# Patient Record
Sex: Female | Born: 1975 | Race: White | Hispanic: No | Marital: Married | Smoking: Former smoker
Health system: Southern US, Community
[De-identification: ages and names within clinical notes are randomized; demographics above are authoritative.]

## PROBLEM LIST (undated history)

## (undated) DIAGNOSIS — Z9889 Other specified postprocedural states: Secondary | ICD-10-CM

## (undated) DIAGNOSIS — Z8719 Personal history of other diseases of the digestive system: Secondary | ICD-10-CM

## (undated) DIAGNOSIS — Z9049 Acquired absence of other specified parts of digestive tract: Secondary | ICD-10-CM

## (undated) DIAGNOSIS — Z9851 Tubal ligation status: Secondary | ICD-10-CM

## (undated) HISTORY — PX: HERNIA REPAIR: SHX51

## (undated) HISTORY — DX: Other specified postprocedural states: Z98.890

## (undated) HISTORY — PX: BREAST SURGERY: SHX581

## (undated) HISTORY — PX: CHOLECYSTECTOMY: SHX55

## (undated) HISTORY — PX: TUBAL LIGATION: SHX77

## (undated) HISTORY — DX: Personal history of other diseases of the digestive system: Z87.19

## (undated) HISTORY — DX: Tubal ligation status: Z98.51

## (undated) HISTORY — DX: Acquired absence of other specified parts of digestive tract: Z90.49

---

## 1996-06-24 DIAGNOSIS — Z9851 Tubal ligation status: Secondary | ICD-10-CM

## 1996-06-24 HISTORY — DX: Tubal ligation status: Z98.51

## 2003-11-03 DIAGNOSIS — Z9049 Acquired absence of other specified parts of digestive tract: Secondary | ICD-10-CM

## 2003-11-03 HISTORY — DX: Acquired absence of other specified parts of digestive tract: Z90.49

## 2004-01-02 ENCOUNTER — Other Ambulatory Visit: Admission: RE | Admit: 2004-01-02 | Discharge: 2004-01-02 | Payer: Self-pay | Admitting: Family Medicine

## 2004-03-21 ENCOUNTER — Ambulatory Visit (HOSPITAL_COMMUNITY): Admission: RE | Admit: 2004-03-21 | Discharge: 2004-03-21 | Payer: Self-pay | Admitting: Internal Medicine

## 2004-03-21 ENCOUNTER — Encounter (INDEPENDENT_AMBULATORY_CARE_PROVIDER_SITE_OTHER): Payer: Self-pay | Admitting: Specialist

## 2004-04-30 ENCOUNTER — Ambulatory Visit: Payer: Self-pay | Admitting: Internal Medicine

## 2004-05-25 ENCOUNTER — Ambulatory Visit: Payer: Self-pay | Admitting: Internal Medicine

## 2005-04-15 ENCOUNTER — Ambulatory Visit (HOSPITAL_COMMUNITY): Admission: RE | Admit: 2005-04-15 | Discharge: 2005-04-15 | Payer: Self-pay | Admitting: Family Medicine

## 2005-04-15 ENCOUNTER — Other Ambulatory Visit: Admission: RE | Admit: 2005-04-15 | Discharge: 2005-04-15 | Payer: Self-pay | Admitting: *Deleted

## 2005-04-24 ENCOUNTER — Ambulatory Visit (HOSPITAL_COMMUNITY): Admission: RE | Admit: 2005-04-24 | Discharge: 2005-04-24 | Payer: Self-pay | Admitting: Internal Medicine

## 2005-04-24 ENCOUNTER — Ambulatory Visit: Payer: Self-pay | Admitting: Internal Medicine

## 2005-05-01 ENCOUNTER — Ambulatory Visit (HOSPITAL_COMMUNITY): Admission: RE | Admit: 2005-05-01 | Discharge: 2005-05-01 | Payer: Self-pay | Admitting: Internal Medicine

## 2005-05-06 ENCOUNTER — Ambulatory Visit: Payer: Self-pay | Admitting: Gastroenterology

## 2005-05-06 ENCOUNTER — Ambulatory Visit (HOSPITAL_COMMUNITY): Admission: RE | Admit: 2005-05-06 | Discharge: 2005-05-06 | Payer: Self-pay | Admitting: Gastroenterology

## 2005-09-16 ENCOUNTER — Ambulatory Visit (HOSPITAL_COMMUNITY): Admission: RE | Admit: 2005-09-16 | Discharge: 2005-09-16 | Payer: Self-pay | Admitting: Surgery

## 2005-10-30 DIAGNOSIS — Z8719 Personal history of other diseases of the digestive system: Secondary | ICD-10-CM

## 2005-10-30 DIAGNOSIS — Z9889 Other specified postprocedural states: Secondary | ICD-10-CM

## 2005-10-30 HISTORY — DX: Personal history of other diseases of the digestive system: Z87.19

## 2008-11-08 ENCOUNTER — Emergency Department (HOSPITAL_COMMUNITY): Admission: EM | Admit: 2008-11-08 | Discharge: 2008-11-08 | Payer: Self-pay | Admitting: Emergency Medicine

## 2009-11-13 IMAGING — CR DG ANKLE COMPLETE 3+V*L*
3 series · 3 of 3 positions shown · non-contrast
Comparison: None

CLINICAL DATA: Left ankle with twisting injury.

LEFT ANKLE COMPLETE - 3+ VIEW

[view not recorded (1 of 3)]
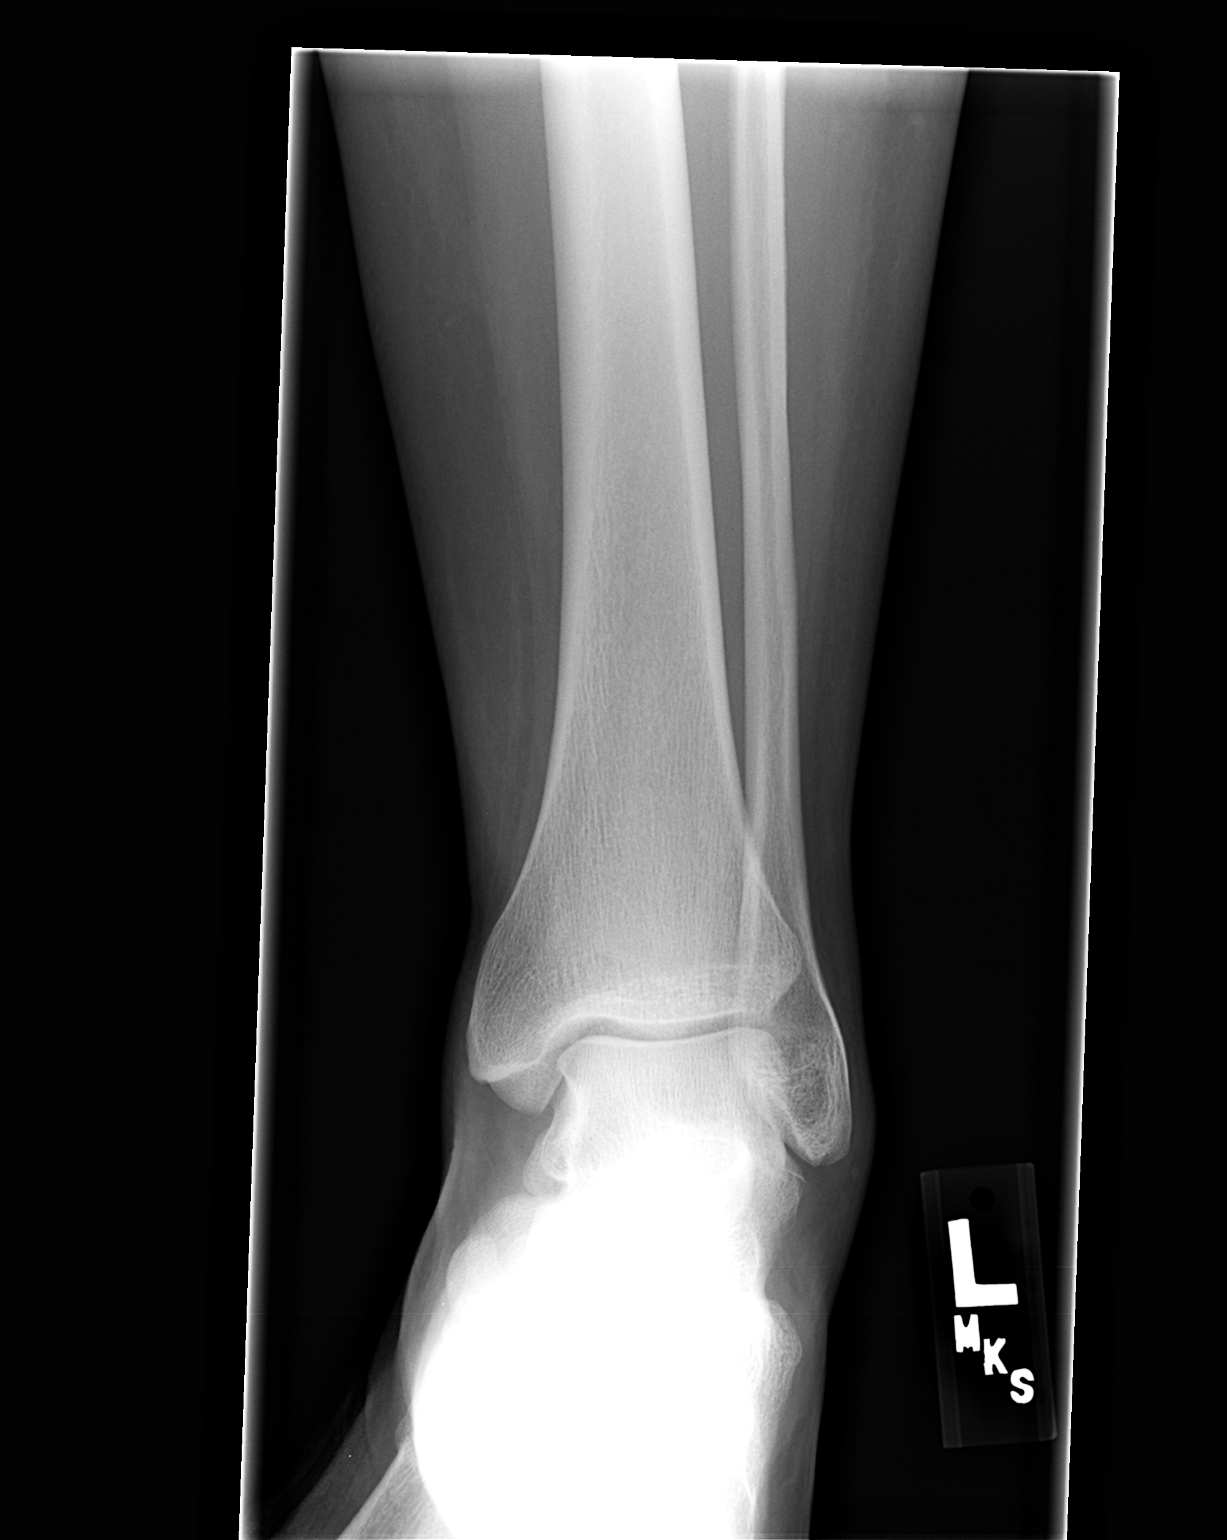

[view not recorded (2 of 3)]
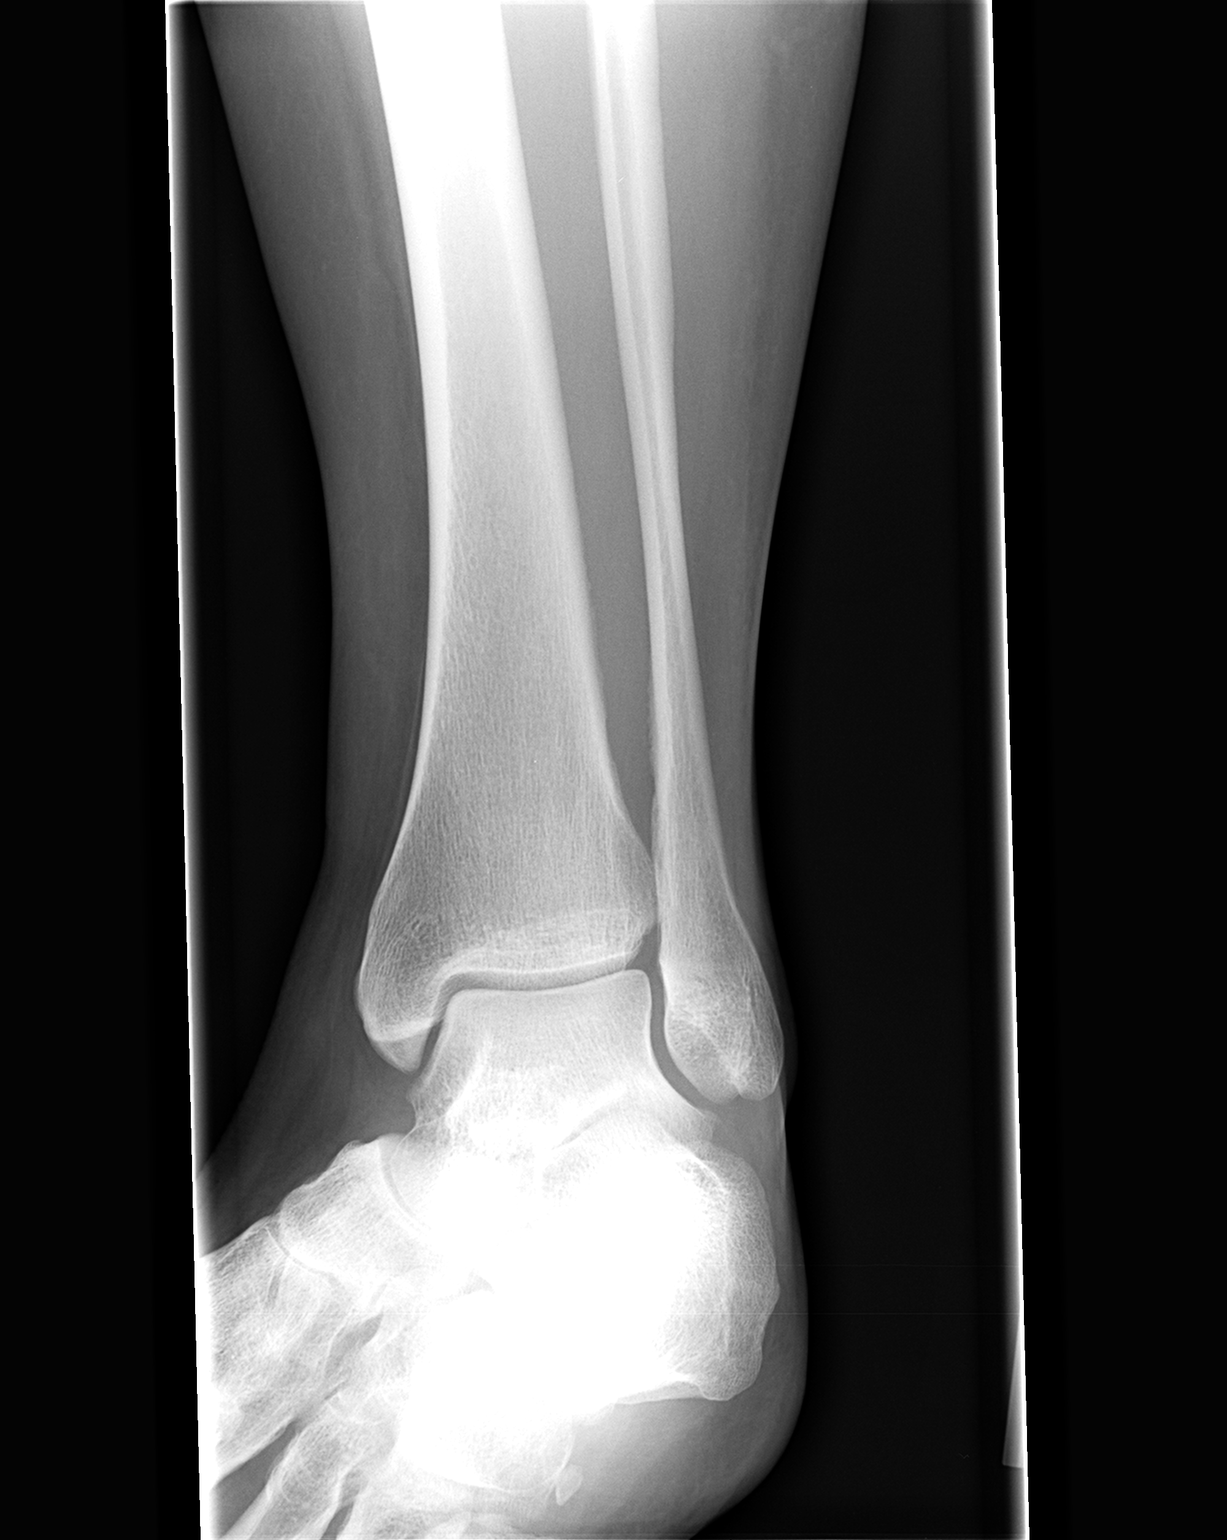

[view not recorded (3 of 3)]
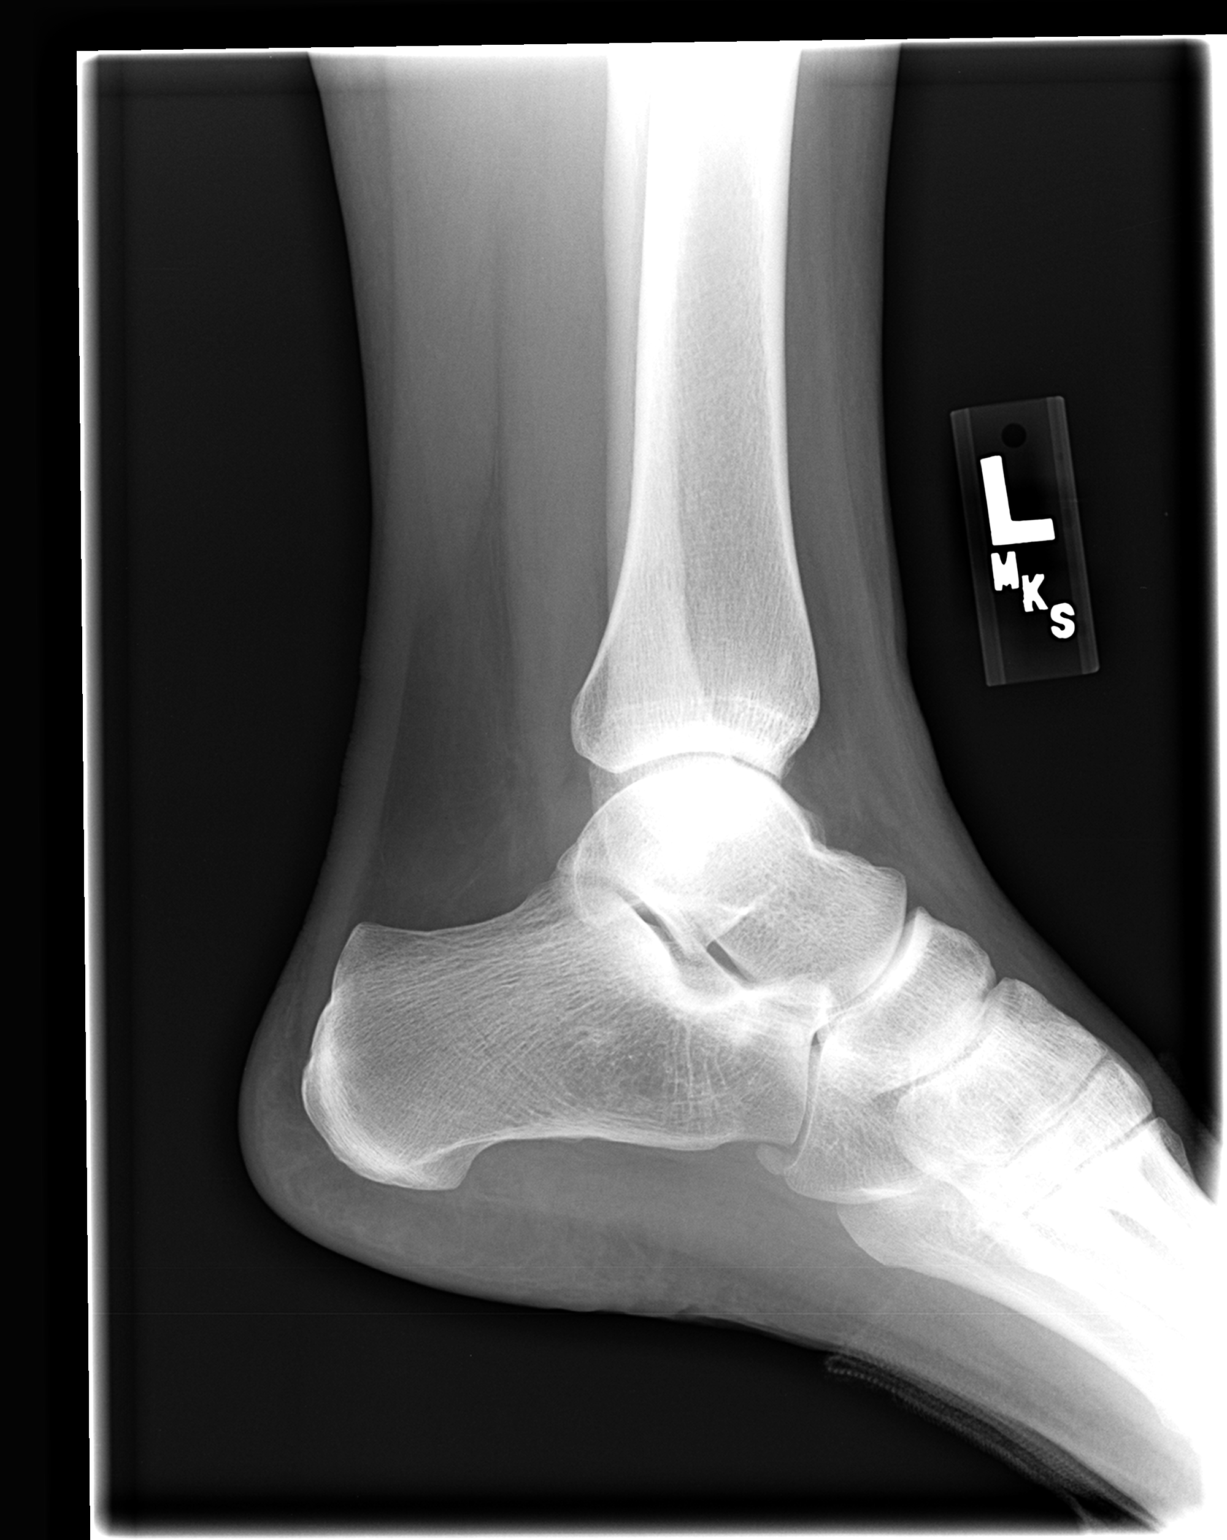

[3 of 3 positions shown; findings below may reference images not displayed]

FINDINGS: There is no evidence of fracture, dislocation or joint
effusion.  There is no evidence of arthropathy or other focal bone
abnormality.  Soft tissues are unremarkable.
IMPRESSION: Negative.

## 2010-07-14 ENCOUNTER — Encounter: Payer: Self-pay | Admitting: Family Medicine

## 2010-11-09 NOTE — Op Note (Signed)
[name], Latoya Lucero               ACCOUNT NO.:  [id number]   MEDICAL RECORD NO.:  [id number]          PATIENT TYPE:  AMB   LOCATION:  DAY                          FACILITY:  [facility]   PHYSICIAN:  Thornton Park. Daphine Deutscher, MD  DATE OF BIRTH:  24-Jan-1976   DATE OF PROCEDURE:  09/16/2005  DATE OF DISCHARGE:  09/16/2005                                 OPERATIVE REPORT   PREOPERATIVE DIAGNOSIS:  Umbilical hernia after previous laparoscopic  surgery.   POSTOPERATIVE DIAGNOSIS:  Umbilical hernia after previous laparoscopic  surgery.   PROCEDURE:  Repair of umbilical hernia with Ventralex mesh the 1.7 inches in  diameter.   SURGEON:  Thornton Park. Daphine Deutscher, M.D.   ASSISTANT:  None.   ANESTHESIA:  General endotracheal.   DESCRIPTION OF PROCEDURE:  [name] was taken to O.R. 6 at [facility] on  the afternoon of September 16, 2005 and given general anesthesia.  The abdomen  was prepped with Betadine and draped sterilely.  I gained access the abdomen  with a 5 mm 0-degree scope through the left upper quadrant, placed a second  5 mm port in the left side.  I lysed the adhesion that was stuck up to the  anterior abdominal wall, and in the area of her discomfort which was around  the umbilicus I could see that the fascia was attenuated, and it had a  little popping sensation with a little preperitoneal hernia.  I went ahead  and cut down this and removed all suture and found this attenuation.  I went  ahead and opened it, put my finger inside, and felt well around this area,  and did not feel any other hernia defects other than this area.  I repaired  this by inserting a piece of Ventralex mesh, suturing it to the perimeter  with six sutures of 0 Prolene.  I then went in with the scope and tacked at  the perimeter with a hernia tacker.  The remaining portion of the abdominal  contents appeared unremarkable.  The wound was then closed with 4-0 Vicryl  subcutaneously and with Benzoin and Steri-Strips.  I  deflated the abdomen  and closed the two incisions with 4-0 Vicryl, and then with Benzoin Steri-  Strips.  The patient seemed to tolerate the procedure well was taken to  recovery room in satisfactory condition.      Thornton Park Daphine Deutscher, MD  Electronically Signed     MBM/MEDQ  D:  09/16/2005  T:  09/18/2005  Job:  [id number]   cc:   Ernestina Penna, M.D.  Fax: [phone]   Lindaann Pascal, M.D.   Lina Sar, M.D. [facility]  [address]  [place]  [place] [address]

## 2013-08-13 ENCOUNTER — Ambulatory Visit (INDEPENDENT_AMBULATORY_CARE_PROVIDER_SITE_OTHER): Payer: Managed Care, Other (non HMO) | Admitting: General Practice

## 2013-08-13 ENCOUNTER — Encounter (INDEPENDENT_AMBULATORY_CARE_PROVIDER_SITE_OTHER): Payer: Self-pay

## 2013-08-13 ENCOUNTER — Encounter: Payer: Self-pay | Admitting: *Deleted

## 2013-08-13 VITALS — BP 131/78 | HR 84 | Temp 97.5°F | Ht 66.3 in | Wt 173.0 lb

## 2013-08-13 DIAGNOSIS — Z0000 Encounter for general adult medical examination without abnormal findings: Secondary | ICD-10-CM

## 2013-08-13 NOTE — Patient Instructions (Signed)

## 2013-08-13 NOTE — Progress Notes (Signed)
   Subjective:    Patient ID: Latoya Lucero, female    DOB: April 04, 1976, 38 y.o.   MRN: [id number]  HPI Patient presents today for annual exam. Denies any chronic health conditions. Reports eating a healthy diet and regular daily exercise. Reports chest congestion for 1 week, than hasn't improved.     Review of Systems  Constitutional: Negative for fever and chills.  Respiratory: Negative for chest tightness and shortness of breath.   Cardiovascular: Negative for chest pain and palpitations.  All other systems reviewed and are negative.       Objective:   Physical Exam  Constitutional: She is oriented to person, place, and time. She appears well-developed and well-nourished.  HENT:  Head: Normocephalic and atraumatic.  Right Ear: External ear normal.  Left Ear: External ear normal.  Mouth/Throat: Oropharynx is clear and moist.  Eyes: Pupils are equal, round, and reactive to light.  Neck: Normal range of motion. Neck supple. No thyromegaly present.  Cardiovascular: Normal rate, regular rhythm and normal heart sounds.   Pulmonary/Chest: Effort normal and breath sounds normal. No respiratory distress. She exhibits no tenderness.  Abdominal: Soft. Bowel sounds are normal. She exhibits no distension. There is no tenderness.  Lymphadenopathy:    She has no cervical adenopathy.  Neurological: She is alert and oriented to person, place, and time.  Skin: Skin is warm and dry.  Psychiatric: She has a normal mood and affect.          Assessment & Plan:  1. Wellness examination - POCT CBC; Future - CMP14+EGFR; Future - Lipid panel; Future -discussed healthy eating and regular exercise Patient verbalized understanding Erby Pian, FNP-C

## 2013-09-06 ENCOUNTER — Other Ambulatory Visit: Payer: Managed Care, Other (non HMO) | Admitting: General Practice

## 2013-09-06 ENCOUNTER — Other Ambulatory Visit (INDEPENDENT_AMBULATORY_CARE_PROVIDER_SITE_OTHER): Payer: Managed Care, Other (non HMO)

## 2013-09-06 DIAGNOSIS — Z0000 Encounter for general adult medical examination without abnormal findings: Secondary | ICD-10-CM

## 2013-09-06 LAB — POCT CBC
Granulocyte percent: 69.7 %G (ref 37–80)
HCT, POC: 40.1 % (ref 37.7–47.9)
Hemoglobin: 12.5 g/dL (ref 12.2–16.2)
Lymph, poc: 1.5 (ref 0.6–3.4)
MCH, POC: 26.3 pg — AB (ref 27–31.2)
MCHC: 31.3 g/dL — AB (ref 31.8–35.4)
MCV: 84 fL (ref 80–97)
MPV: 7.6 fL (ref 0–99.8)
POC Granulocyte: 4.2 (ref 2–6.9)
POC LYMPH PERCENT: 24.6 %L (ref 10–50)
Platelet Count, POC: 333 10*3/uL (ref 142–424)
RBC: 4.8 M/uL (ref 4.04–5.48)
RDW, POC: 13.9 %
WBC: 6 10*3/uL (ref 4.6–10.2)

## 2013-09-06 NOTE — Progress Notes (Signed)
Patient came in for labs only.

## 2013-09-07 LAB — CMP14+EGFR
ALT: 11 IU/L (ref 0–32)
AST: 15 IU/L (ref 0–40)
Albumin/Globulin Ratio: 1.8 (ref 1.1–2.5)
Albumin: 4.2 g/dL (ref 3.5–5.5)
Alkaline Phosphatase: 55 IU/L (ref 39–117)
BUN/Creatinine Ratio: 9 (ref 8–20)
BUN: 7 mg/dL (ref 6–20)
CO2: 21 mmol/L (ref 18–29)
Calcium: 9 mg/dL (ref 8.7–10.2)
Chloride: 103 mmol/L (ref 97–108)
Creatinine, Ser: 0.81 mg/dL (ref 0.57–1.00)
GFR calc Af Amer: 106 mL/min/{1.73_m2} (ref >59)
GFR calc non Af Amer: 92 mL/min/{1.73_m2} (ref >59)
Globulin, Total: 2.4 g/dL (ref 1.5–4.5)
Glucose: 87 mg/dL (ref 65–99)
Potassium: 4.6 mmol/L (ref 3.5–5.2)
Sodium: 137 mmol/L (ref 134–144)
Total Bilirubin: 0.4 mg/dL (ref 0.0–1.2)
Total Protein: 6.6 g/dL (ref 6.0–8.5)

## 2013-09-07 LAB — LIPID PANEL
Chol/HDL Ratio: 3.3 ratio units (ref 0.0–4.4)
Cholesterol, Total: 157 mg/dL (ref 100–199)
HDL: 47 mg/dL (ref >39)
LDL Calculated: 94 mg/dL (ref 0–99)
Triglycerides: 79 mg/dL (ref 0–149)
VLDL Cholesterol Cal: 16 mg/dL (ref 5–40)

## 2013-09-27 ENCOUNTER — Encounter: Payer: Self-pay | Admitting: General Practice

## 2013-09-27 ENCOUNTER — Ambulatory Visit (INDEPENDENT_AMBULATORY_CARE_PROVIDER_SITE_OTHER): Payer: Managed Care, Other (non HMO) | Admitting: General Practice

## 2013-09-27 VITALS — BP 130/69 | HR 84 | Temp 97.8°F | Ht 66.3 in | Wt 175.0 lb

## 2013-09-27 DIAGNOSIS — Z124 Encounter for screening for malignant neoplasm of cervix: Secondary | ICD-10-CM

## 2013-09-27 DIAGNOSIS — Z01419 Encounter for gynecological examination (general) (routine) without abnormal findings: Secondary | ICD-10-CM

## 2013-09-27 LAB — POCT UA - MICROSCOPIC ONLY
Bacteria, U Microscopic: NEGATIVE
Casts, Ur, LPF, POC: NEGATIVE
Crystals, Ur, HPF, POC: NEGATIVE
Mucus, UA: NEGATIVE
WBC, Ur, HPF, POC: NEGATIVE
Yeast, UA: NEGATIVE

## 2013-09-27 LAB — POCT URINALYSIS DIPSTICK
Bilirubin, UA: NEGATIVE
Glucose, UA: NEGATIVE
Ketones, UA: NEGATIVE
Leukocytes, UA: NEGATIVE
Nitrite, UA: NEGATIVE
Protein, UA: NEGATIVE
Spec Grav, UA: 1.015
Urobilinogen, UA: NEGATIVE
pH, UA: 6.5

## 2013-09-27 NOTE — Progress Notes (Signed)
   Subjective:    Patient ID: Latoya Lucero, female    DOB: 1976/06/12, 38 y.o.   MRN: [id number]  HPI Patient presents today for gyn exam. Denies any complaints at this time.     Review of Systems  Constitutional: Negative for fever and chills.  Respiratory: Negative for chest tightness and shortness of breath.   Cardiovascular: Negative for chest pain and palpitations.  Genitourinary: Negative for dysuria, hematuria, vaginal bleeding, difficulty urinating and vaginal pain.  Neurological: Negative for dizziness, weakness and headaches.       Objective:   Physical Exam  Constitutional: She is oriented to person, place, and time. She appears well-developed and well-nourished.  HENT:  Head: Normocephalic and atraumatic.  Right Ear: External ear normal.  Left Ear: External ear normal.  Mouth/Throat: Oropharynx is clear and moist.  Eyes: Conjunctivae and EOM are normal. Pupils are equal, round, and reactive to light.  Neck: Normal range of motion. Neck supple. No thyromegaly present.  Cardiovascular: Normal rate, regular rhythm, normal heart sounds and intact distal pulses.   Pulmonary/Chest: Effort normal and breath sounds normal. No respiratory distress. She exhibits no tenderness. Right breast exhibits no inverted nipple, no mass, no nipple discharge, no skin change and no tenderness. Left breast exhibits no inverted nipple, no mass, no nipple discharge, no skin change and no tenderness. Breasts are symmetrical.  Abdominal: Soft. Bowel sounds are normal. She exhibits no distension.  Genitourinary: Vagina normal and uterus normal. No breast swelling, tenderness, discharge or bleeding. No labial fusion. There is no rash, tenderness, lesion or injury on the right labia. There is no rash, tenderness, lesion or injury on the left labia. Uterus is not deviated, not enlarged, not fixed and not tender. Cervix exhibits no motion tenderness, no discharge and no friability. Right adnexum displays  no mass, no tenderness and no fullness. Left adnexum displays no mass, no tenderness and no fullness. No erythema, tenderness or bleeding around the vagina. No foreign body around the vagina. No signs of injury around the vagina. No vaginal discharge found.  Lymphadenopathy:    She has no cervical adenopathy.  Neurological: She is alert and oriented to person, place, and time.  Skin: Skin is warm and dry.  Psychiatric: She has a normal mood and affect.          Assessment & Plan:  1. Encounter for routine gynecological examination - POCT urinalysis dipstick - POCT UA - Microscopic Only - Pap IG w/ reflex to HPV when ASC-U -RTO prn Patient verbalized understanding Latoya KeensMae E. Loany Neuroth, FNP-C

## 2013-09-27 NOTE — Patient Instructions (Signed)

## 2013-09-28 LAB — PAP IG W/ RFLX HPV ASCU: PAP Smear Comment: 0

## 2014-04-01 ENCOUNTER — Encounter: Payer: Self-pay | Admitting: Family

## 2014-04-01 ENCOUNTER — Ambulatory Visit (INDEPENDENT_AMBULATORY_CARE_PROVIDER_SITE_OTHER): Payer: BC Managed Care – PPO | Admitting: Family

## 2014-04-01 VITALS — BP 125/69 | HR 92 | Temp 98.9°F | Ht 66.0 in | Wt 173.8 lb

## 2014-04-01 DIAGNOSIS — G43109 Migraine with aura, not intractable, without status migrainosus: Secondary | ICD-10-CM

## 2014-04-01 MED ORDER — BUTALBITAL-APAP-CAFFEINE 50-325-40 MG PO TABS: 1.0000 | ORAL_TABLET | Freq: Four times a day (QID) | ORAL | Status: AC | PRN

## 2014-04-01 MED ORDER — KETOROLAC TROMETHAMINE 60 MG/2ML IM SOLN
60.0000 mg | Freq: Once | INTRAMUSCULAR | Status: AC
  Administered 2014-04-01: 60 mg via INTRAMUSCULAR

## 2014-04-01 NOTE — Progress Notes (Signed)
   Subjective:    Patient ID: Latoya PalmsLetitia T Mckellips, female    DOB: 1975-09-11, 38 y.o.   MRN: [id number]  Headache  This is a new problem. The current episode started in the past 7 days (Tuesday). The problem occurs constantly. The problem has been unchanged. The pain is located in the right unilateral region. The pain does not radiate. The pain quality is similar to prior headaches. The quality of the pain is described as squeezing and throbbing. The pain is at a severity of 8/10. The pain is moderate. Associated symptoms include blurred vision, coughing, ear pain and nausea. Pertinent negatives include no back pain, phonophobia, photophobia, sore throat or vomiting. Nothing aggravates the symptoms. She has tried acetaminophen and Excedrin for the symptoms. The treatment provided mild relief. Her past medical history is significant for migraine headaches.      Review of Systems  Constitutional: Negative.   HENT: Positive for ear pain. Negative for sore throat.   Eyes: Positive for blurred vision. Negative for photophobia.  Respiratory: Positive for cough. Negative for shortness of breath.   Cardiovascular: Negative.  Negative for palpitations.  Gastrointestinal: Positive for nausea. Negative for vomiting.  Endocrine: Negative.   Genitourinary: Negative.   Musculoskeletal: Negative.  Negative for back pain.  Neurological: Positive for headaches.  Hematological: Negative.   Psychiatric/Behavioral: Negative.   All other systems reviewed and are negative.      Objective:   Physical Exam  Vitals reviewed. Constitutional: She is oriented to person, place, and time. She appears well-developed and well-nourished. No distress.  HENT:  Head: Normocephalic and atraumatic.  Right Ear: External ear normal.  Left Ear: External ear normal.  Nose: Nose normal.  Mouth/Throat: Oropharynx is clear and moist.  Eyes: Pupils are equal, round, and reactive to light.  Neck: Normal range of motion. Neck  supple. No thyromegaly present.  Cardiovascular: Normal rate, regular rhythm, normal heart sounds and intact distal pulses.   No murmur heard. Pulmonary/Chest: Effort normal and breath sounds normal. No respiratory distress. She has no wheezes.  Abdominal: Soft. Bowel sounds are normal. She exhibits no distension. There is no tenderness.  Musculoskeletal: Normal range of motion. She exhibits no edema and no tenderness.  Neurological: She is alert and oriented to person, place, and time. She has normal reflexes. No cranial nerve deficit.  Skin: Skin is warm and dry.  Psychiatric: She has a normal mood and affect. Her behavior is normal. Judgment and thought content normal.      BP 125/69  Pulse 92  Temp(Src) 98.9 F (37.2 C) (Oral)  Ht 5\' 6"  (1.676 m)  Wt 173 lb 12.8 oz (78.835 kg)  BMI 28.07 kg/m2     Assessment & Plan:  1. Migraine with aura and without status migrainosus, not intractable -rest -Limit alcohol -Stress management  -If worsens or changes in vision go to ED - ketorolac (TORADOL) injection 60 mg; Inject 2 mLs (60 mg total) into the muscle once. - butalbital-acetaminophen-caffeine (FIORICET) 50-325-40 MG per tablet; Take 1 tablet by mouth every 6 (six) hours as needed for headache.  Dispense: 20 tablet; Refill: 0  Jannifer Rodneyhristy Kaelin Holford, FNP

## 2014-04-01 NOTE — Patient Instructions (Signed)

## 2014-06-10 ENCOUNTER — Ambulatory Visit: Payer: BC Managed Care – PPO

## 2014-06-20 ENCOUNTER — Ambulatory Visit (INDEPENDENT_AMBULATORY_CARE_PROVIDER_SITE_OTHER): Payer: BC Managed Care – PPO | Admitting: *Deleted

## 2014-06-20 DIAGNOSIS — Z23 Encounter for immunization: Secondary | ICD-10-CM

## 2015-05-10 ENCOUNTER — Telehealth: Payer: Self-pay | Admitting: Family

## 2019-02-01 ENCOUNTER — Telehealth: Payer: Self-pay | Admitting: Family

## 2019-02-01 NOTE — Telephone Encounter (Signed)
Shot record is ready for pick up °
